# Patient Record
Sex: Female | Born: 1965 | Race: White | Hispanic: Yes | Marital: Single | Smoking: Never smoker
Health system: Southern US, Community
[De-identification: ages and names within clinical notes are randomized; demographics above are authoritative.]

## PROBLEM LIST (undated history)

## (undated) DIAGNOSIS — G40909 Epilepsy, unspecified, not intractable, without status epilepticus: Secondary | ICD-10-CM

## (undated) DIAGNOSIS — IMO0001 Reserved for inherently not codable concepts without codable children: Secondary | ICD-10-CM

## (undated) HISTORY — DX: Reserved for inherently not codable concepts without codable children: IMO0001

## (undated) HISTORY — PX: OTHER SURGICAL HISTORY: SHX169

## (undated) HISTORY — PX: RHINOPLASTY: SUR1284

## (undated) HISTORY — DX: Epilepsy, unspecified, not intractable, without status epilepticus: G40.909

---

## 2009-11-20 ENCOUNTER — Ambulatory Visit: Payer: Self-pay | Admitting: Diagnostic Radiology

## 2009-11-20 ENCOUNTER — Emergency Department (HOSPITAL_BASED_OUTPATIENT_CLINIC_OR_DEPARTMENT_OTHER): Admission: EM | Admit: 2009-11-20 | Discharge: 2009-11-20 | Payer: Self-pay | Admitting: Emergency Medicine

## 2010-08-31 IMAGING — CR DG WRIST COMPLETE 3+V*L*
4 series · 4 of 4 positions shown · non-contrast
Comparison: None

CLINICAL DATA: Patient fell on ice injuring left wrist

LEFT WRIST - COMPLETE 3+ VIEW

[x wrist pa left]
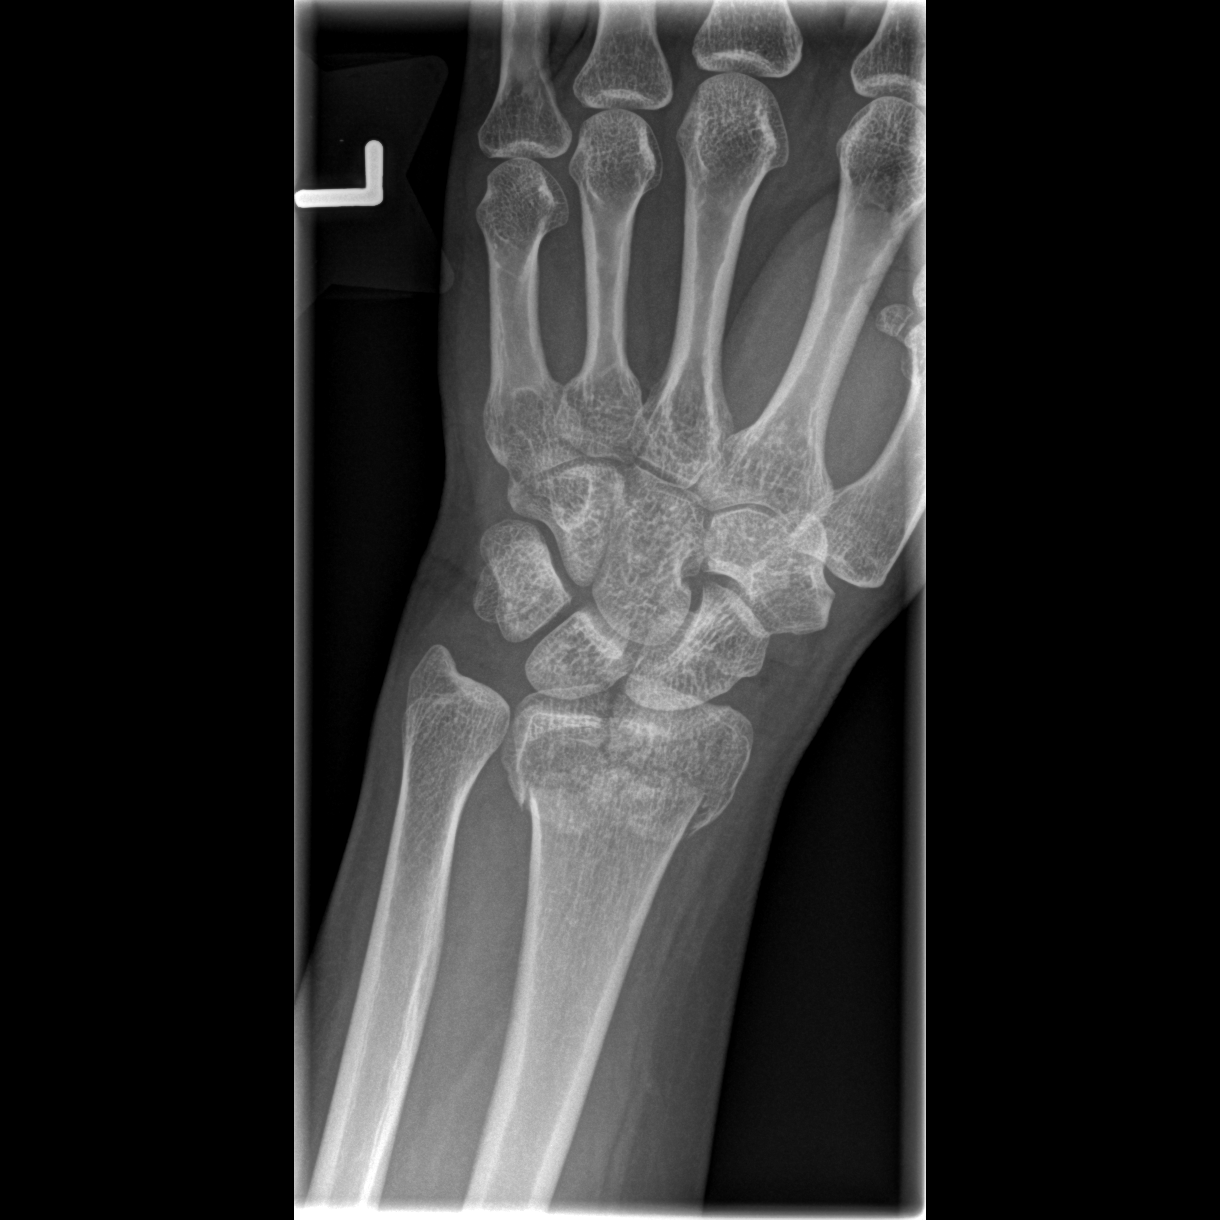

[x wrist obl left]
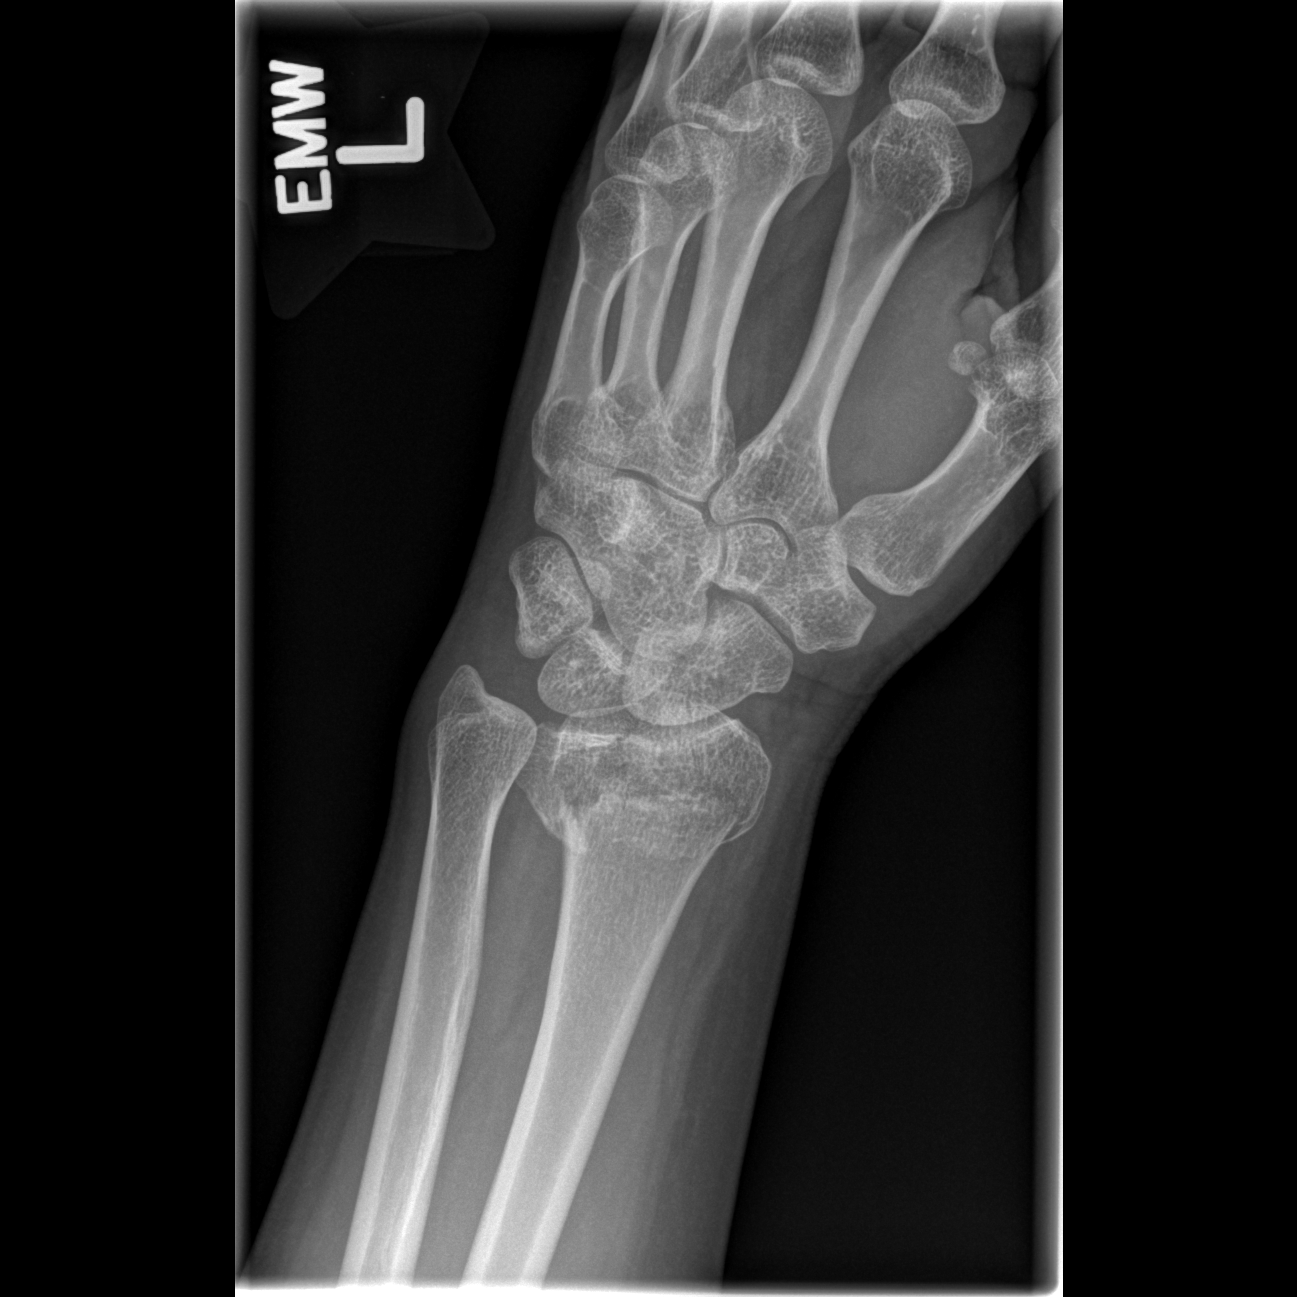

[x wrist lat left]
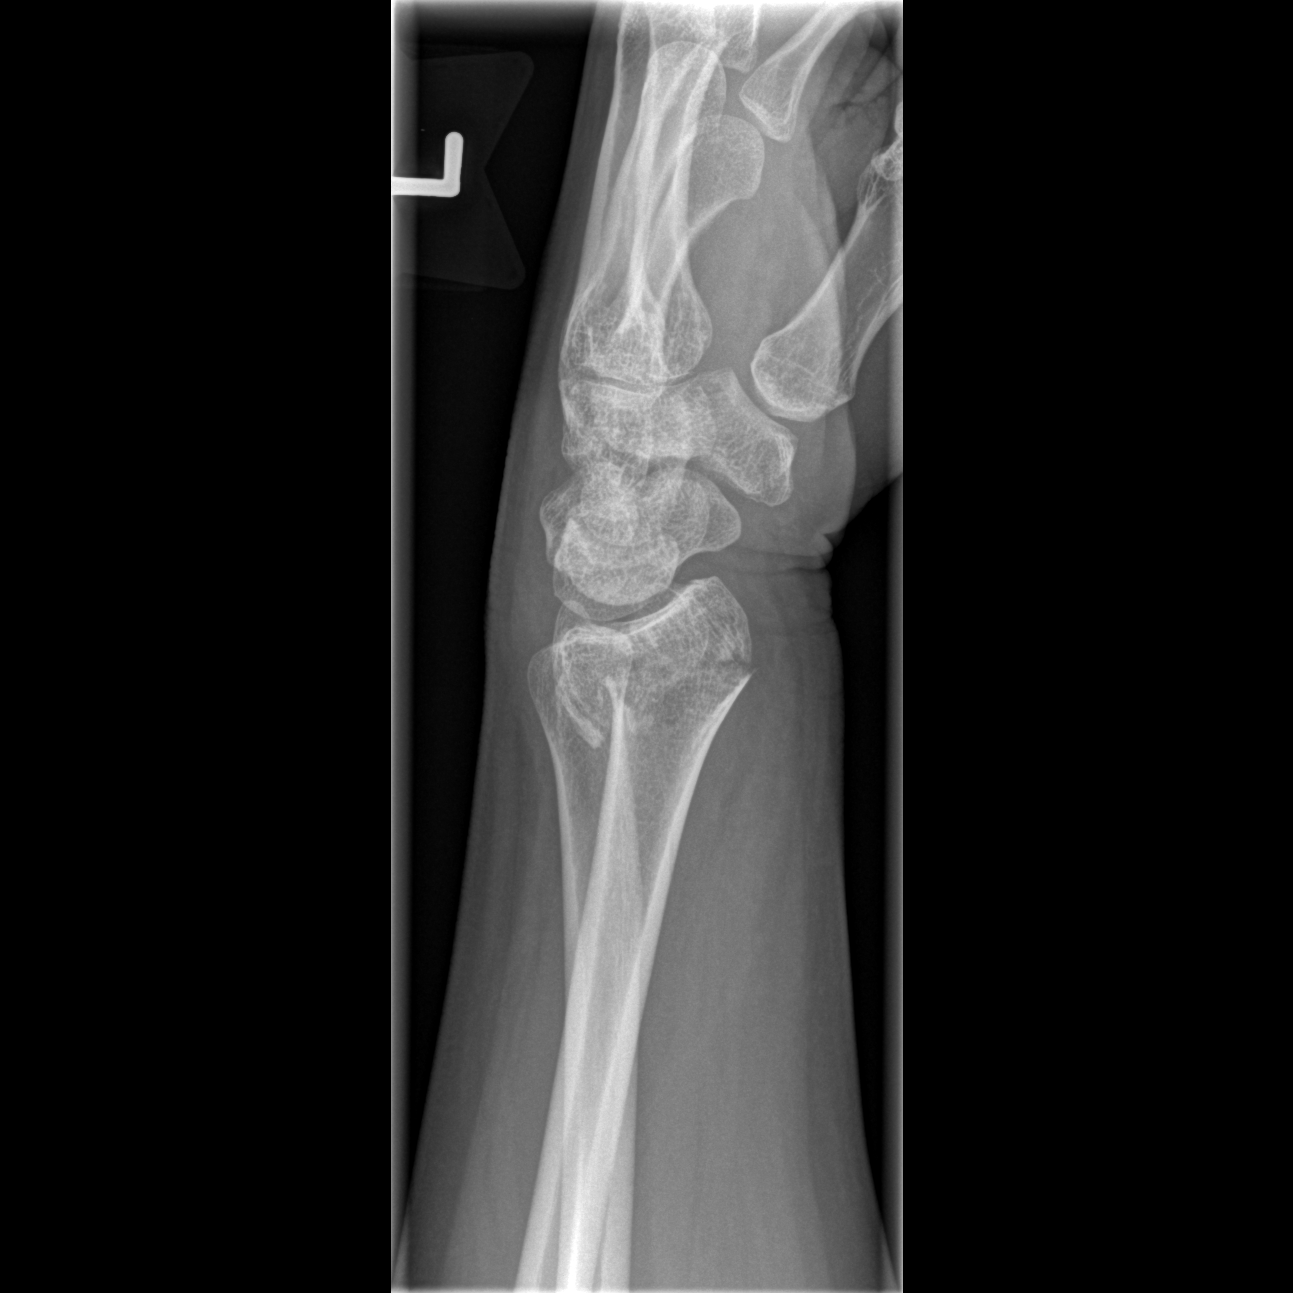

[x navicular]
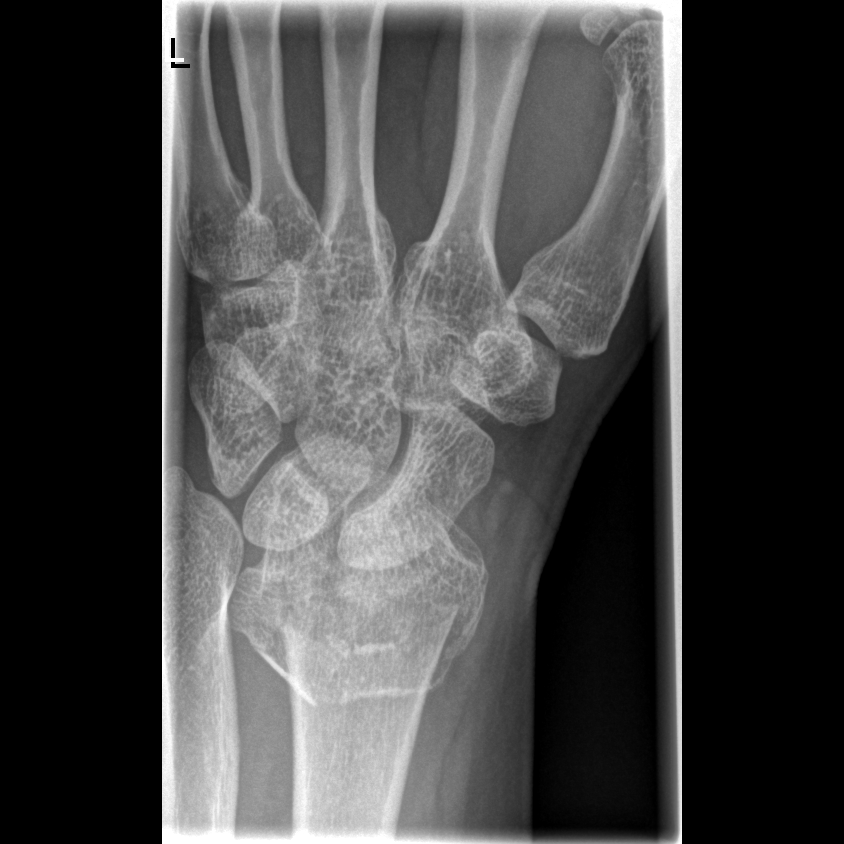

[4 of 4 positions shown; findings below may reference images not displayed]

FINDINGS: There is a transverse comminuted slightly impacted and
angulated fracture of the distal left radius with the fracture
extending intra-articular.  The ulnar styloid is intact.  The
carpal bones are in normal position.
IMPRESSION: Impacted slightly angulated comminuted fracture of the distal left
radius which extends intra-articular.

## 2010-11-30 ENCOUNTER — Encounter: Payer: Self-pay | Admitting: Internal Medicine

## 2011-05-28 ENCOUNTER — Other Ambulatory Visit: Payer: Self-pay | Admitting: Diagnostic Neuroimaging

## 2011-05-28 DIAGNOSIS — G40309 Generalized idiopathic epilepsy and epileptic syndromes, not intractable, without status epilepticus: Secondary | ICD-10-CM

## 2011-05-28 DIAGNOSIS — G40209 Localization-related (focal) (partial) symptomatic epilepsy and epileptic syndromes with complex partial seizures, not intractable, without status epilepticus: Secondary | ICD-10-CM

## 2012-08-17 ENCOUNTER — Encounter: Payer: Self-pay | Admitting: Internal Medicine

## 2012-11-03 ENCOUNTER — Ambulatory Visit (INDEPENDENT_AMBULATORY_CARE_PROVIDER_SITE_OTHER): Payer: BC Managed Care – PPO | Admitting: Internal Medicine

## 2012-11-03 ENCOUNTER — Encounter: Payer: Self-pay | Admitting: Internal Medicine

## 2012-11-03 VITALS — BP 128/82 | HR 96 | Temp 98.2°F | Ht 63.0 in | Wt 150.0 lb

## 2012-11-03 DIAGNOSIS — D509 Iron deficiency anemia, unspecified: Secondary | ICD-10-CM

## 2012-11-03 DIAGNOSIS — E611 Iron deficiency: Secondary | ICD-10-CM

## 2012-11-03 DIAGNOSIS — Z0000 Encounter for general adult medical examination without abnormal findings: Secondary | ICD-10-CM

## 2012-11-03 DIAGNOSIS — G40909 Epilepsy, unspecified, not intractable, without status epilepticus: Secondary | ICD-10-CM | POA: Insufficient documentation

## 2012-11-03 LAB — CBC WITH DIFFERENTIAL/PLATELET
Basophils Relative: 0.4 % (ref 0.0–3.0)
Eosinophils Relative: 1.8 % (ref 0.0–5.0)
Hemoglobin: 13.1 g/dL (ref 12.0–15.0)
Lymphocytes Relative: 18 % (ref 12.0–46.0)
MCV: 87.7 fl (ref 78.0–100.0)
Monocytes Relative: 7.3 % (ref 3.0–12.0)
Neutro Abs: 6 10*3/uL (ref 1.4–7.7)
Neutrophils Relative %: 72.5 % (ref 43.0–77.0)

## 2012-11-03 LAB — IRON: Iron: 73 ug/dL (ref 42–145)

## 2012-11-03 NOTE — Assessment & Plan Note (Signed)
Few months ago, she complained of dizziness, after  labs she was told she had iron deficiency, took iron by mouth temporarily, dizziness decreased to some extent. Plan: Labs

## 2012-11-03 NOTE — Assessment & Plan Note (Addendum)
G0P0 Last Pap smear and mammogram 10/2011 Plans to see her  gynecologist. Flu shot discussed, declined

## 2012-11-03 NOTE — Progress Notes (Signed)
  Subjective:    Patient ID: Karla Bell, female    DOB: 07-19-1966, 46 y.o.   MRN: [id number]  HPI New patient, here to get established History of epilepsy, symptoms well-controlled on the same dose of phenobarbital for years. History of iron deficiency, diagnosed 3 months ago, see assessment and plan. Several years history of dizziness, symptoms last a few seconds, not associated with seizures activities, usually triggered by stress/depression. At some point her previous MD thought that dizziness was related to low iron, prescribed iron and she felt a slightly better temporarily.  Past Medical History  Diagnosis Date  . Epilepsy     dx at age 47   . Contraception     condoms    Past Surgical History  Procedure Date  . No past surgeries    History   Social History  . Marital Status: Single    Spouse Name: N/A    Number of Children: 0  . Years of Education: N/A   Occupational History  . 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 Smoking status: Never Smoker   . Smokeless tobacco: Never Used  . Alcohol Use: Yes     Comment: socially   . Drug Use: No  . Sexually Active: Not on file   Other Topics Concern  . Not on file   Social History Narrative   Born in [place], moved to [place] 2000, in [place] since ~ 2008. lives by herself, has a boyfriend   No family history on file.   Review of Systems Denies any headaches, nausea, vomiting. No blood in the stools. Periods are monthly, they last 5 days, the first 2 days are heavy.     Objective:   Physical Exam  General -- alert, well-developed   Neck --no thyromegaly Lungs -- normal respiratory effort, no intercostal retractions, no accessory muscle use, and normal breath sounds.   Heart-- normal rate, regular rhythm, no murmur, and no gallop.   Abdomen--soft, non-tender, no distention, no masses, no HSM, no guarding, and no rigidity.   Extremities-- no pretibial edema  bilaterally  Neurologic-- alert & oriented X3 ; speech gait and motor are intact. Psych-- Cognition and judgment appear intact. Alert and cooperative with normal attention span and concentration.  not anxious appearing and not depressed appearing.      Assessment & Plan:

## 2012-11-03 NOTE — Assessment & Plan Note (Addendum)
Diagnosed with epilepsy at age 46, later on she was started on phenobarbital and has been basically well controlled for many years. Has seen in the recent past a neurologist in [place] but would like to change provider. Also has chronic dizziness, at some point felt to be due to iron deficiency. Will ask neurology to asses  dizziness as well. Plan: Refer to neurology [facility], Dr. Craige Cotta

## 2012-11-09 ENCOUNTER — Encounter: Payer: Self-pay | Admitting: *Deleted

## 2012-12-18 ENCOUNTER — Ambulatory Visit: Payer: BC Managed Care – PPO | Admitting: Obstetrics and Gynecology

## 2012-12-18 ENCOUNTER — Encounter: Payer: Self-pay | Admitting: Obstetrics and Gynecology

## 2012-12-18 VITALS — BP 110/70 | HR 70 | Resp 16 | Ht 61.0 in | Wt 147.0 lb

## 2012-12-18 DIAGNOSIS — Z124 Encounter for screening for malignant neoplasm of cervix: Secondary | ICD-10-CM

## 2012-12-18 DIAGNOSIS — Z01419 Encounter for gynecological examination (general) (routine) without abnormal findings: Secondary | ICD-10-CM

## 2012-12-18 MED ORDER — NONFORMULARY OR COMPOUNDED ITEM: 600.0000 mg | Status: DC

## 2012-12-18 NOTE — Progress Notes (Signed)
ANNUAL GYNECOLOGIC EXAMINATION   Karla Bell is a 47 y.o. female, No obstetric history on file., who presents for an annual exam.  The patient has a history of epilepsy.  She still wants to have a towel one day.    History   Social History  . Marital Status: Single    Spouse Name: N/A    Number of Children: 0  . Years of Education: N/A   Occupational History  . 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 Smoking status: Never Smoker   . Smokeless tobacco: Never Used  . Alcohol Use: Yes     Comment: socially   . Drug Use: No  . Sexually Active: Yes    Birth Control/ Protection: None, Condom   Other Topics Concern  . None   Social History Narrative   Born in [place], moved to [place] 2000, in [place] since ~ 2008. lives by herself, has a boyfriend             Menstrual cycle:   LMP: Patient's last menstrual period was 12/12/2012.             The following portions of the patient's history were reviewed and updated as appropriate: allergies, current medications, past family history, past medical history, past social history, past surgical history and problem list.  Review of Systems Pertinent items are noted in HPI. Breast:Negative for breast lump,nipple discharge or nipple retraction Gastrointestinal: Negative for abdominal pain, change in bowel habits or rectal bleeding Urinary:negative   Objective:    BP 110/70  Pulse 70  Resp 16  Ht 5\' 1"  (1.549 m)  Wt 147 lb (66.679 kg)  BMI 27.79 kg/m2  LMP 12/12/2012    Weight:  Wt Readings from Last 1 Encounters:  12/18/12 147 lb (66.679 kg)          BMI: Body mass index is 27.79 kg/(m^2).  General Appearance: Alert, appropriate appearance for age. No acute distress HEENT: Grossly normal Neck / Thyroid: Supple, no masses, nodes or enlargement Lungs: clear to auscultation bilaterally Back: No CVA tenderness Breast Exam: No masses or nodes.No dimpling, nipple  retraction or discharge. Cardiovascular: Regular rate and rhythm. S1, S2, no murmur Gastrointestinal: Soft, non-tender, no masses or organomegaly  ++++++++++++++++++++++++++++++++++++++++++++++++++++++++  Pelvic Exam: External genitalia: normal general appearance Vaginal: normal without tenderness, induration or masses. Relaxation: No Cervix: normal appearance Adnexa: normal bimanual exam Uterus: normal size, shape, and consistency Rectovaginal: normal rectal, no masses  ++++++++++++++++++++++++++++++++++++++++++++++++++++++++  Lymphatic Exam: Non-palpable nodes in neck, clavicular, axillary, or inguinal regions Neurologic: Normal speech, no tremor  Psychiatric: Alert and oriented, appropriate affect.  Assessment:    Normal gyn exam   Overweight or obese: Yes   Pelvic relaxation: No  epilepsy   Plan:    mammogram pap smear return annually or prn Contraception:condoms    Medications prescribed: none  STD screen request: No   The updated Pap smear screening guidelines were discussed with the patient. The patient requested that I obtain a Pap smear: Yes.  Kegel exercises discussed: No.  Proper diet and regular exercise were reviewed.  Annual mammograms recommended starting at age 22. Proper breast care was discussed.  Screening colonoscopy is recommended beginning at age 70.  Regular health maintenance was reviewed.  Leonard Schwartz M.D.    Regular Periods: yes Mammogram: no  Monthly Breast Ex.: no Exercise: yes  Tetanus < 10 years: yes Seatbelts: yes  NI. Bladder Functn.: yes Abuse  at home: no  Daily BM's: yes Stressful Work: yes  Healthy Diet: yes Sigmoid-Colonoscopy: None  Calcium: no Medical problems this year: None   LAST PAP:09/09/2011 WNL  Contraception: Condoms  Mammogram:  None  PCP: Hose Paz  PMH: None  FMH: NOne  Last Bone Scan: None

## 2012-12-19 LAB — PAP IG W/ RFLX HPV ASCU

## 2013-09-03 ENCOUNTER — Ambulatory Visit: Payer: BC Managed Care – PPO | Admitting: Internal Medicine

## 2013-09-10 ENCOUNTER — Encounter: Payer: Self-pay | Admitting: Internal Medicine

## 2013-09-10 ENCOUNTER — Ambulatory Visit (INDEPENDENT_AMBULATORY_CARE_PROVIDER_SITE_OTHER): Payer: BC Managed Care – PPO | Admitting: Internal Medicine

## 2013-09-10 ENCOUNTER — Telehealth: Payer: Self-pay | Admitting: *Deleted

## 2013-09-10 VITALS — BP 128/87 | HR 82 | Temp 98.5°F

## 2013-09-10 DIAGNOSIS — Z0289 Encounter for other administrative examinations: Secondary | ICD-10-CM

## 2013-09-10 DIAGNOSIS — K5900 Constipation, unspecified: Secondary | ICD-10-CM

## 2013-09-10 NOTE — Telephone Encounter (Signed)
09/10/2013  Pt came for appt today at 2:30 for irregular bowel movements.  Dr Drue Novel was running behind, and pt had to get back to work, so left with out being seen.  Pt was refunded copay and said she will call to reschedule.  bw

## 2013-09-13 NOTE — Progress Notes (Signed)
  Subjective:    Patient ID: Karla Bell, female    DOB: [birth year], 47 y.o.   MRN: [id number]  HPI Left w/o being seen   Review of Systems     Objective:   Physical Exam        Assessment & Plan:

## 2013-11-15 DIAGNOSIS — L908 Other atrophic disorders of skin: Secondary | ICD-10-CM

## 2013-11-15 DIAGNOSIS — L988 Other specified disorders of the skin and subcutaneous tissue: Secondary | ICD-10-CM | POA: Insufficient documentation

## 2013-11-30 ENCOUNTER — Other Ambulatory Visit: Payer: Self-pay | Admitting: Obstetrics and Gynecology

## 2013-12-03 ENCOUNTER — Encounter (HOSPITAL_COMMUNITY): Payer: Self-pay | Admitting: Pharmacist

## 2013-12-05 ENCOUNTER — Encounter (HOSPITAL_COMMUNITY)
Admission: RE | Admit: 2013-12-05 | Discharge: 2013-12-05 | Disposition: A | Discharge status: Home / Self Care | Payer: BC Managed Care – PPO | Source: Ambulatory Visit | Attending: Obstetrics and Gynecology | Admitting: Obstetrics and Gynecology

## 2013-12-05 ENCOUNTER — Encounter (HOSPITAL_COMMUNITY): Payer: Self-pay

## 2013-12-05 DIAGNOSIS — Z01812 Encounter for preprocedural laboratory examination: Secondary | ICD-10-CM | POA: Insufficient documentation

## 2013-12-05 DIAGNOSIS — Z01818 Encounter for other preprocedural examination: Secondary | ICD-10-CM | POA: Insufficient documentation

## 2013-12-05 LAB — CBC
HCT: 32.4 % — ABNORMAL LOW (ref 36.0–46.0)
Hemoglobin: 10 g/dL — ABNORMAL LOW (ref 12.0–15.0)
MCH: 21.9 pg — ABNORMAL LOW (ref 26.0–34.0)
MCHC: 30.9 g/dL (ref 30.0–36.0)
MCV: 70.9 fL — ABNORMAL LOW (ref 78.0–100.0)
PLATELETS: 275 10*3/uL (ref 150–400)
RBC: 4.57 MIL/uL (ref 3.87–5.11)
RDW: 18.2 % — AB (ref 11.5–15.5)
WBC: 6.4 10*3/uL (ref 4.0–10.5)

## 2013-12-05 NOTE — Patient Instructions (Addendum)
   Your procedure is scheduled on:  Wednesday, Feb 4  Enter through the [employer] [facility] at:  11 AM Pick up the phone at the desk and dial [phone] and inform us of your arrival.  Please call this number if you have any problems the morning of surgery: [phone]  Remember: Do not eat food after midnight: Tuesday Do not drink clear liquids after: 830 AM Wednesday, day of surgery Take these medicines the morning of surgery with a SIP OF WATER:   Keppra, PHENObarbital  Do not wear jewelry, make-up, or FINGER nail polish No metal in your hair or on your body. Do not wear lotions, powders, perfumes.  You may wear deodorant.   Do not bring valuables to the hospital. Contacts, dentures or bridgework may not be worn into surgery.  Patients discharged on the day of surgery will not be allowed to drive home.  Home with boyfriend Randall Hiss cell [phone].

## 2013-12-06 ENCOUNTER — Encounter: Payer: Self-pay | Admitting: Obstetrics and Gynecology

## 2013-12-06 ENCOUNTER — Other Ambulatory Visit (HOSPITAL_COMMUNITY): Payer: Self-pay | Admitting: Obstetrics and Gynecology

## 2013-12-06 NOTE — H&P (Signed)
Karla Bell is an 48 y.o. female. Pt presented to [facility], PA-C c/o post coital bleeding and had an u/s showing a 2.2cm endometrial polyp.  She discussed options and recs with the patient and the patien wanted to proceed with removal and was scheduled for hysteroscopy/D&C with removal of em polyp.  Pertinent Gynecological History: No h/o abnl paps  Menstrual History:  Patient's last menstrual period was 11/14/2013.    Past Medical History  Diagnosis Date  . Contraception     condoms   . Epilepsy     dx at age 77 - last seizure 4 yrs ago    Past Surgical History  Procedure Laterality Date  . Rhinoplasty    . Left wrist surgery      No family history on file.  Social History:  reports that she has never smoked. She has never used smokeless tobacco. She reports that she drinks about 3.0 ounces of alcohol per week. She reports that she does not use illicit drugs.  Allergies: No Known Allergies   (Not in a hospital admission)  ROS Non-contributory.  Menses are otherwise nl with no intermenstrual bleeding.  Last menstrual period 11/14/2013. Physical Exam  Lungs CTA CV RRR Abd soft, NT, Ext no calf tenderness Pelvic deferred to OR (pt declined in office day of preop appt bc it had recently been done by EP)  Results for orders placed during the hospital encounter of 12/05/13 (from the past 24 hour(s))  CBC     Status: Abnormal   Collection Time    12/05/13  2:45 PM      Result Value Range   WBC 6.4  4.0 - 10.5 K/uL   RBC 4.57  3.87 - 5.11 MIL/uL   Hemoglobin 10.0 (*) 12.0 - 15.0 g/dL   HCT 32.4 (*) 36.0 - 46.0 %   MCV 70.9 (*) 78.0 - 100.0 fL   MCH 21.9 (*) 26.0 - 34.0 pg   MCHC 30.9  30.0 - 36.0 g/dL   RDW 18.2 (*) 11.5 - 15.5 %   Platelets 275  150 - 400 K/uL    No results found.  Assessment/Plan: P0 with post coital bleeding dx'd with 2.2cm endometrial polyp.  Pt wants to proceed with hysteroscopy/D&C/Polypectomy/possible resection.  R/B/A discussed.   Questions answered.  Pt is scheduled at [facility] on 12/12/13.  Ozella Comins Y 12/06/2013, 2:09 PM

## 2013-12-06 NOTE — H&P (Signed)
Karla Bell is a 48 y.o. female G 0 for hysteroscopic resection of and endometrial mass.  For the past year the patient's  menses had become progressively heavy,  lasting 5 days and requiring, instead of 2 pads a day, 5 pad a day.  She denies any cramps but in recent months has developed post-coital bleeding that lasts  for 10 days after intercourse.  She denies any pain with intercourse, vaginitis symptoms, changes in urinary function or bowel function (history of constipation).    A sono-hysterogram/ultrasound showed: uterus:6.64 x 5.63 x 4.75 cm with an 8.55 mm endometrium per 3D rendering; ; There was an anterior right intramural fibroid 2.97 x 1.88 x 2.26 cm;, a mass on posterior uterine cavity wall 2.2 cm x 0.89 cm (polyp vs fibroid) with no blood flow seen in this area.  Both ovaries appeared normal on this study. Length of uterus from fundus to external os 9.9 cm and cavity width-3.9, length-5.2. Given the findings on sonographic imaging and the disruptive nature of her symptoms,  the patient desires to proceed with hysteroscopic resection of her endometrial mass.  Past Medical History  OB History: G 0  GYN History: menarche: 48 YO;  LMP: 11/05/2013;    Contracepton: none  Denies history of abnormal PAP smear;   Last PAP smear: 12/2012  Medical History: Left Wrist Fracture, Seizure Disorder, Anemia, Anxiety and Infertility  Surgical History: 1995 Rhinoplasty;   2010 Left Wrist ORIF with Screw Placement and 2011 Liposuction-cheeks Denies problems with anesthesia or history of blood transfusions  Family History: Alzheimer's Dementia and Hypertension  Social History: Divorced and employed with [employer];  Denies tobacco use but consumes alcohol on occasion   Medications:  Phenobarbital 100 mg,  2 po qhs Levetiracetam ER 500 mg 2 po qhs Antipyrine-benzocaine Ear Drops  prn Tretinoin 0.05% Topical Cream apply to face daily qhs  No Known Allergies  Denies sensitivity to peanuts,  shellfish, soy, latex or adhesives.    ROS:Admits to reading glasses, constipation, shortness of breath with anxiety, dry/cracked inflamed hands (patient washes her hands frequently) but denies: headache, vision changes, nasal congestion, dysphagia, tinnitus, dizziness, hoarseness, cough,  chest pain, nausea, vomiting, diarrhea,constipation,  urinary frequency, urgency  dysuria, hematuria, vaginitis symptoms, pelvic pain, swelling of joints,easy bruising,  myalgias, arthralgias, unexplained weight loss and except as is mentioned in the history of present illness, patient's review of systems is otherwise negative.  Physical Exam  Bp: 110/70;   P: 70;  R: 18;   Temperature: 97.8 degrees F orally;   Weight: 144 lbs.   Height: 5' 1"   BMI: 27.2  Neck: supple without masses or thyromegaly Lungs: clear to auscultation Heart: regular rate and rhythm Abdomen: soft, non-tender and no organomegaly Pelvic:EGBUS- wnl; vagina-normal rugae; uterus-normal size, cervix without lesions or motion tenderness; adnexae-no tenderness or masses Extremities:  no clubbing, cyanosis or edema   Assesment: Post-coital Bleeding           Menorrhagia   Disposition:  A discussion was held with patient regarding the indication for her procedure(s) along with the risks, which include but are not limited to: reaction to anesthesia, damage to adjacent organs, infection and excessive bleeding. The patient verbalized understanding of these risks and has consented to proceed with Hysterocopic Resection of Endometrial Mass at [facility], December 12, 2013 at 1:30 p.m.   CSN# [id number]   Latavia Goga J. Eureka Valdes, PA-C  for Dr. Angela Y. Roberts   

## 2013-12-12 ENCOUNTER — Ambulatory Visit (HOSPITAL_COMMUNITY): Payer: BC Managed Care – PPO | Admitting: Anesthesiology

## 2013-12-12 ENCOUNTER — Encounter (HOSPITAL_COMMUNITY): Payer: BC Managed Care – PPO | Admitting: Anesthesiology

## 2013-12-12 ENCOUNTER — Encounter (HOSPITAL_COMMUNITY)
Admission: RE | Disposition: A | Discharge status: Home / Self Care | Payer: Self-pay | Source: Ambulatory Visit | Attending: Obstetrics and Gynecology

## 2013-12-12 ENCOUNTER — Ambulatory Visit (HOSPITAL_COMMUNITY)
Admission: RE | Admit: 2013-12-12 | Discharge: 2013-12-12 | Disposition: A | Discharge status: Home / Self Care | Payer: BC Managed Care – PPO | Source: Ambulatory Visit | Attending: Obstetrics and Gynecology | Admitting: Obstetrics and Gynecology

## 2013-12-12 ENCOUNTER — Encounter (HOSPITAL_COMMUNITY): Payer: Self-pay | Admitting: *Deleted

## 2013-12-12 DIAGNOSIS — N930 Postcoital and contact bleeding: Secondary | ICD-10-CM | POA: Insufficient documentation

## 2013-12-12 DIAGNOSIS — D251 Intramural leiomyoma of uterus: Secondary | ICD-10-CM | POA: Insufficient documentation

## 2013-12-12 DIAGNOSIS — G40909 Epilepsy, unspecified, not intractable, without status epilepticus: Secondary | ICD-10-CM | POA: Insufficient documentation

## 2013-12-12 DIAGNOSIS — N840 Polyp of corpus uteri: Secondary | ICD-10-CM | POA: Insufficient documentation

## 2013-12-12 DIAGNOSIS — N920 Excessive and frequent menstruation with regular cycle: Secondary | ICD-10-CM | POA: Insufficient documentation

## 2013-12-12 HISTORY — PX: DILITATION & CURRETTAGE/HYSTROSCOPY WITH VERSAPOINT RESECTION: SHX5571

## 2013-12-12 LAB — HCG, SERUM, QUALITATIVE: PREG SERUM: NEGATIVE

## 2013-12-12 SURGERY — DILATATION & CURETTAGE/HYSTEROSCOPY WITH VERSAPOINT RESECTION
Anesthesia: General

## 2013-12-12 MED ORDER — MIDAZOLAM HCL 2 MG/2ML IJ SOLN
INTRAMUSCULAR | Status: DC | PRN
  Administered 2013-12-12: 2 mg via INTRAVENOUS

## 2013-12-12 MED ORDER — KETOROLAC TROMETHAMINE 30 MG/ML IJ SOLN: 15.0000 mg | Freq: Once | INTRAMUSCULAR | Status: DC | PRN

## 2013-12-12 MED ORDER — LIDOCAINE HCL 1 % IJ SOLN
INTRAMUSCULAR | Status: DC | PRN
  Administered 2013-12-12: 10 mL

## 2013-12-12 MED ORDER — PROPOFOL 10 MG/ML IV BOLUS
INTRAVENOUS | Status: DC | PRN
  Administered 2013-12-12: 180 mg via INTRAVENOUS

## 2013-12-12 MED ORDER — DEXAMETHASONE SODIUM PHOSPHATE 10 MG/ML IJ SOLN
INTRAMUSCULAR | Status: AC
  Filled 2013-12-12: qty 1

## 2013-12-12 MED ORDER — KETOROLAC TROMETHAMINE 30 MG/ML IJ SOLN
INTRAMUSCULAR | Status: DC | PRN
  Administered 2013-12-12: 30 mg via INTRAVENOUS

## 2013-12-12 MED ORDER — LIDOCAINE HCL (CARDIAC) 20 MG/ML IV SOLN
INTRAVENOUS | Status: DC | PRN
  Administered 2013-12-12: 50 mg via INTRAVENOUS

## 2013-12-12 MED ORDER — PHENYLEPHRINE 40 MCG/ML (10ML) SYRINGE FOR IV PUSH (FOR BLOOD PRESSURE SUPPORT)
PREFILLED_SYRINGE | INTRAVENOUS | Status: AC
  Filled 2013-12-12: qty 5

## 2013-12-12 MED ORDER — PROPOFOL 10 MG/ML IV EMUL
INTRAVENOUS | Status: AC
  Filled 2013-12-12: qty 20

## 2013-12-12 MED ORDER — PROMETHAZINE HCL 25 MG/ML IJ SOLN: 6.2500 mg | INTRAMUSCULAR | Status: DC | PRN

## 2013-12-12 MED ORDER — ONDANSETRON HCL 4 MG/2ML IJ SOLN
INTRAMUSCULAR | Status: AC
  Filled 2013-12-12: qty 2

## 2013-12-12 MED ORDER — LIDOCAINE HCL 1 % IJ SOLN
INTRAMUSCULAR | Status: AC
  Filled 2013-12-12: qty 20

## 2013-12-12 MED ORDER — MEPERIDINE HCL 25 MG/ML IJ SOLN: 6.2500 mg | INTRAMUSCULAR | Status: DC | PRN

## 2013-12-12 MED ORDER — SODIUM CHLORIDE 0.9 % IR SOLN
Status: DC | PRN
  Administered 2013-12-12: 3000 mL

## 2013-12-12 MED ORDER — IBUPROFEN 600 MG PO TABS: 600.0000 mg | ORAL_TABLET | Freq: Four times a day (QID) | ORAL | Status: DC | PRN

## 2013-12-12 MED ORDER — FENTANYL CITRATE 0.05 MG/ML IJ SOLN
INTRAMUSCULAR | Status: DC | PRN
  Administered 2013-12-12: 50 ug via INTRAVENOUS
  Administered 2013-12-12: 100 ug via INTRAVENOUS

## 2013-12-12 MED ORDER — FENTANYL CITRATE 0.05 MG/ML IJ SOLN
INTRAMUSCULAR | Status: AC
  Filled 2013-12-12: qty 5

## 2013-12-12 MED ORDER — OXYCODONE-ACETAMINOPHEN 5-325 MG PO TABS: 1.0000 | ORAL_TABLET | Freq: Four times a day (QID) | ORAL | Status: DC | PRN

## 2013-12-12 MED ORDER — MIDAZOLAM HCL 2 MG/2ML IJ SOLN
INTRAMUSCULAR | Status: AC
  Filled 2013-12-12: qty 2

## 2013-12-12 MED ORDER — FENTANYL CITRATE 0.05 MG/ML IJ SOLN: 25.0000 ug | INTRAMUSCULAR | Status: DC | PRN

## 2013-12-12 MED ORDER — DEXAMETHASONE SODIUM PHOSPHATE 10 MG/ML IJ SOLN
INTRAMUSCULAR | Status: DC | PRN
  Administered 2013-12-12: 10 mg via INTRAVENOUS

## 2013-12-12 MED ORDER — KETOROLAC TROMETHAMINE 30 MG/ML IJ SOLN
INTRAMUSCULAR | Status: AC
  Filled 2013-12-12: qty 1

## 2013-12-12 MED ORDER — ROCURONIUM BROMIDE 100 MG/10ML IV SOLN
INTRAVENOUS | Status: AC
  Filled 2013-12-12: qty 1

## 2013-12-12 MED ORDER — LACTATED RINGERS IV SOLN
INTRAVENOUS | Status: DC
  Administered 2013-12-12 (×3): via INTRAVENOUS

## 2013-12-12 MED ORDER — LIDOCAINE HCL (CARDIAC) 20 MG/ML IV SOLN
INTRAVENOUS | Status: AC
  Filled 2013-12-12: qty 5

## 2013-12-12 MED ORDER — ONDANSETRON HCL 4 MG/2ML IJ SOLN
INTRAMUSCULAR | Status: DC | PRN
  Administered 2013-12-12: 4 mg via INTRAVENOUS

## 2013-12-12 MED ORDER — MIDAZOLAM HCL 2 MG/2ML IJ SOLN: 0.5000 mg | Freq: Once | INTRAMUSCULAR | Status: DC | PRN

## 2013-12-12 SURGICAL SUPPLY — 18 items
CANISTER SUCT 3000ML (MISCELLANEOUS) ×3 IMPLANT
CATH ROBINSON RED A/P 16FR (CATHETERS) ×3 IMPLANT
CLOTH BEACON ORANGE TIMEOUT ST (SAFETY) ×3 IMPLANT
CONTAINER PREFILL 10% NBF 60ML (FORM) ×6 IMPLANT
DRSG TELFA 3X8 NADH (GAUZE/BANDAGES/DRESSINGS) ×3 IMPLANT
ELECT REM PT RETURN 9FT ADLT (ELECTROSURGICAL) ×3
ELECTRODE REM PT RTRN 9FT ADLT (ELECTROSURGICAL) ×1 IMPLANT
ELECTRODE RT ANGLE VERSAPOINT (CUTTING LOOP) ×3 IMPLANT
GLOVE BIO SURGEON STRL SZ7.5 (GLOVE) ×3 IMPLANT
GLOVE BIOGEL PI IND STRL 7.5 (GLOVE) ×1 IMPLANT
GLOVE BIOGEL PI INDICATOR 7.5 (GLOVE) ×2
GOWN STRL REUS W/ TWL XL LVL3 (GOWN DISPOSABLE) ×1 IMPLANT
GOWN STRL REUS W/TWL LRG LVL3 (GOWN DISPOSABLE) ×3 IMPLANT
GOWN STRL REUS W/TWL XL LVL3 (GOWN DISPOSABLE) ×2
PACK HYSTEROSCOPY LF (CUSTOM PROCEDURE TRAY) ×3 IMPLANT
PAD OB MATERNITY 4.3X12.25 (PERSONAL CARE ITEMS) ×3 IMPLANT
TOWEL OR 17X24 6PK STRL BLUE (TOWEL DISPOSABLE) ×6 IMPLANT
WATER STERILE IRR 1000ML POUR (IV SOLUTION) ×3 IMPLANT

## 2013-12-12 NOTE — Interval H&P Note (Signed)
History and Physical Interval Note:  12/12/2013 12:50 PM  Karla Bell  has presented today for surgery, with the diagnosis of abdominal pain  The various methods of treatment have been discussed with the patient and family. After consideration of risks, benefits and other options for treatment, the patient has consented to  Procedure(s): DILATATION & CURETTAGE/HYSTEROSCOPY WITH VERSAPOINT RESECTION (N/A) as a surgical intervention .  The patient's history has been reviewed, patient examined, no change in status, stable for surgery.  I have reviewed the patient's chart and labs.  Questions were answered to the patient's satisfaction.     Delice Lesch

## 2013-12-12 NOTE — Addendum Note (Signed)
Addendum created 12/12/13 1555 by Asher Muir, CRNA   Modules edited: Anesthesia LDA

## 2013-12-12 NOTE — Anesthesia Preprocedure Evaluation (Signed)
Anesthesia Evaluation  Patient identified by MRN, date of birth, ID band Patient awake    Reviewed: Allergy & Precautions, H&P , Patient's Chart, lab work & pertinent test results, reviewed documented beta blocker date and time   History of Anesthesia Complications Negative for: history of anesthetic complications  Airway Mallampati: II TM Distance: >3 FB Neck ROM: full    Dental   Pulmonary  breath sounds clear to auscultation        Cardiovascular Exercise Tolerance: Good Rhythm:regular Rate:Normal     Neuro/Psych Seizures -,  negative psych ROS   GI/Hepatic   Endo/Other    Renal/GU      Musculoskeletal   Abdominal   Peds  Hematology   Anesthesia Other Findings   Reproductive/Obstetrics                           Anesthesia Physical Anesthesia Plan  ASA: II  Anesthesia Plan: General LMA   Post-op Pain Management:    Induction:   Airway Management Planned:   Additional Equipment:   Intra-op Plan:   Post-operative Plan:   Informed Consent: I have reviewed the patients History and Physical, chart, labs and discussed the procedure including the risks, benefits and alternatives for the proposed anesthesia with the patient or authorized representative who has indicated his/her understanding and acceptance.   Dental Advisory Given  Plan Discussed with: CRNA, Surgeon and Anesthesiologist  Anesthesia Plan Comments:         Anesthesia Quick Evaluation

## 2013-12-12 NOTE — H&P (View-Only) (Signed)
Karla Bell is a 48 y.o. female G 0 for hysteroscopic resection of and endometrial mass.  For the past year the patient's  menses had become progressively heavy,  lasting 5 days and requiring, instead of 2 pads a day, 5 pad a day.  She denies any cramps but in recent months has developed post-coital bleeding that lasts  for 10 days after intercourse.  She denies any pain with intercourse, vaginitis symptoms, changes in urinary function or bowel function (history of constipation).    A sono-hysterogram/ultrasound showed: uterus:6.64 x 5.63 x 4.75 cm with an 8.55 mm endometrium per 3D rendering; ; There was an anterior right intramural fibroid 2.97 x 1.88 x 2.26 cm;, a mass on posterior uterine cavity wall 2.2 cm x 0.89 cm (polyp vs fibroid) with no blood flow seen in this area.  Both ovaries appeared normal on this study. Length of uterus from fundus to external os 9.9 cm and cavity width-3.9, length-5.2. Given the findings on sonographic imaging and the disruptive nature of her symptoms,  the patient desires to proceed with hysteroscopic resection of her endometrial mass.  Past Medical History  OB History: G 0  GYN History: menarche: 48 YO;  LMP: 11/05/2013;    Contracepton: none  Denies history of abnormal PAP smear;   Last PAP smear: 12/2012  Medical History: Left Wrist Fracture, Seizure Disorder, Anemia, Anxiety and Infertility  Surgical History: 1995 Rhinoplasty;   2010 Left Wrist ORIF with Screw Placement and 2011 Liposuction-cheeks Denies problems with anesthesia or history of blood transfusions  Family History: Alzheimer's Dementia and Hypertension  Social History: Divorced and employed with [employer];  Denies tobacco use but consumes alcohol on occasion   Medications:  Phenobarbital 100 mg,  2 po qhs Levetiracetam ER 500 mg 2 po qhs Antipyrine-benzocaine Ear Drops  prn Tretinoin 0.05% Topical Cream apply to face daily qhs  No Known Allergies  Denies sensitivity to peanuts,  shellfish, soy, latex or adhesives.    ZTI:WPYKDX to reading glasses, constipation, shortness of breath with anxiety, dry/cracked inflamed hands (patient washes her hands frequently) but denies: headache, vision changes, nasal congestion, dysphagia, tinnitus, dizziness, hoarseness, cough,  chest pain, nausea, vomiting, diarrhea,constipation,  urinary frequency, urgency  dysuria, hematuria, vaginitis symptoms, pelvic pain, swelling of joints,easy bruising,  myalgias, arthralgias, unexplained weight loss and except as is mentioned in the history of present illness, patient's review of systems is otherwise negative.  Physical Exam  Bp: 110/70;   P: 70;  R: 18;   Temperature: 97.8 degrees F orally;   Weight: 144 lbs.   Height: 5\' 1"    BMI: 27.2  Neck: supple without masses or thyromegaly Lungs: clear to auscultation Heart: regular rate and rhythm Abdomen: soft, non-tender and no organomegaly Pelvic:EGBUS- wnl; vagina-normal rugae; uterus-normal size, cervix without lesions or motion tenderness; adnexae-no tenderness or masses Extremities:  no clubbing, cyanosis or edema   Assesment: Post-coital Bleeding           Menorrhagia   Disposition:  A discussion was held with patient regarding the indication for her procedure(s) along with the risks, which include but are not limited to: reaction to anesthesia, damage to adjacent organs, infection and excessive bleeding. The patient verbalized understanding of these risks and has consented to proceed with Hysterocopic Resection of Endometrial Mass at [place], December 12, 2013 at 1:30 p.m.   CSN# [id number]   Rickie Gutierres J. Florene Glen, PA-C  for Dr. Harvie Bridge. Mancel Bale

## 2013-12-12 NOTE — Anesthesia Postprocedure Evaluation (Signed)
Anesthesia Post Note  Patient: Karla Bell  Procedure(s) Performed: Procedure(s) (LRB): DILATATION & CURETTAGE/HYSTEROSCOPY WITH VERSAPOINT RESECTION (N/A)  Anesthesia type: GA  Patient location: PACU  Post pain: Pain level controlled  Post assessment: Post-op Vital signs reviewed  Last Vitals:  Filed Vitals:   12/12/13 1415  BP: 117/68  Pulse: 67  Temp:   Resp: 18    Post vital signs: Reviewed  Level of consciousness: sedated  Complications: No apparent anesthesia complications

## 2013-12-12 NOTE — Discharge Instructions (Signed)

## 2013-12-12 NOTE — Transfer of Care (Signed)
Immediate Anesthesia Transfer of Care Note  Patient: Karla Bell  Procedure(s) Performed: Procedure(s): DILATATION & CURETTAGE/HYSTEROSCOPY WITH VERSAPOINT RESECTION (N/A)  Patient Location: PACU  Anesthesia Type:General  Level of Consciousness: sedated  Airway & Oxygen Therapy: Patient Spontanous Breathing and Patient connected to nasal cannula oxygen  Post-op Assessment: Report given to PACU RN  Post vital signs: Reviewed and stable  Complications: No apparent anesthesia complications

## 2013-12-12 NOTE — Op Note (Signed)
Preop Diagnosis: Postcoital Bleeding   Postop Diagnosis: Postcoital Bleeding   Procedure: DILATATION & CURETTAGE/HYSTEROSCOPY WITH VERSAPOINT RESECTION   Anesthesia: General   Anesthesiologist: Assunta Gambles, MD   Attending: Delice Lesch, MD   Assistant: N/a  Findings: ?polyp on anterior wall of uterus  Pathology: Polyp with endometrial curettings  Fluids: 1200 cc  UOP: 300 cc  EBL: Minimal  Complications: None  Procedure: The patient was taken to the operating room after the risks, benefits and alternatives were discussed with the patient. The patient verbalized understanding and consent signed and witnessed. The patient was placed under general anesthesia with an LMA per anesthesiologist and prepped and draped in the normal sterile fashion.  Time Out was performed per protocol.  A bivalve speculum was placed in the patient's vagina and the anterior lip of the cervix was grasped with a single tooth tenaculum. A paracervical block was administered using a total of 10 cc of 1% lidocaine. The uterus sounded to 10 cm. The cervix was dilated for passage of the hysteroscope.  The hysteroscope was introduced into the uterine cavity and findings as noted above. Sharp curettage was performed until a gritty texture was noted and curettings were sent to pathology. The hysteroscope was reintroduced and no obvious remaining intracavitary lesions were noted.  All instruments were removed. Sponge lap and needle count was correct. The patient tolerated the procedure well and was returned to the recovery room in good condition.

## 2013-12-14 ENCOUNTER — Encounter (HOSPITAL_COMMUNITY): Payer: Self-pay | Admitting: Obstetrics and Gynecology

## 2015-11-11 DIAGNOSIS — F410 Panic disorder [episodic paroxysmal anxiety] without agoraphobia: Secondary | ICD-10-CM | POA: Insufficient documentation

## 2015-11-11 DIAGNOSIS — F419 Anxiety disorder, unspecified: Secondary | ICD-10-CM | POA: Insufficient documentation

## 2015-12-04 DIAGNOSIS — N926 Irregular menstruation, unspecified: Secondary | ICD-10-CM | POA: Insufficient documentation

## 2016-06-16 DIAGNOSIS — G40309 Generalized idiopathic epilepsy and epileptic syndromes, not intractable, without status epilepticus: Secondary | ICD-10-CM | POA: Insufficient documentation

## 2018-03-15 ENCOUNTER — Encounter: Payer: Self-pay | Admitting: Neurology

## 2018-04-06 DIAGNOSIS — N840 Polyp of corpus uteri: Secondary | ICD-10-CM | POA: Insufficient documentation

## 2018-06-01 ENCOUNTER — Ambulatory Visit (INDEPENDENT_AMBULATORY_CARE_PROVIDER_SITE_OTHER): Payer: Managed Care, Other (non HMO) | Admitting: Neurology

## 2018-06-01 ENCOUNTER — Encounter: Payer: Self-pay | Admitting: Neurology

## 2018-06-01 ENCOUNTER — Other Ambulatory Visit: Payer: Self-pay

## 2018-06-01 VITALS — BP 128/78 | HR 85 | Ht 61.0 in | Wt 140.0 lb

## 2018-06-01 DIAGNOSIS — G40019 Localization-related (focal) (partial) idiopathic epilepsy and epileptic syndromes with seizures of localized onset, intractable, without status epilepticus: Secondary | ICD-10-CM | POA: Diagnosis not present

## 2018-06-01 DIAGNOSIS — N930 Postcoital and contact bleeding: Secondary | ICD-10-CM | POA: Insufficient documentation

## 2018-06-01 MED ORDER — LEVETIRACETAM 500 MG PO TABS: ORAL_TABLET | ORAL | 11 refills | Status: DC

## 2018-06-01 MED ORDER — CARBAMAZEPINE 200 MG PO TABS: ORAL_TABLET | ORAL | 1 refills | Status: DC

## 2018-06-01 MED ORDER — ZONISAMIDE 100 MG PO CAPS: ORAL_CAPSULE | ORAL | 11 refills | Status: DC
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
## 2018-06-01 NOTE — Patient Instructions (Addendum)
1. Start Zonisamide 100mg : Take 1 capsule every night for 2 weeks, then increase to 2 capsules every night for 2 weeks, then increase to 3 capsules every night and continue  2. When you increase the new medication (Zonisamide) to 2 capsules, you can start reducing the carbamazepine to 1 tablet daily. Then after 2 weeks, you can stop the carbamazepine  3. Continue Keppra XR 1500mg  daily  4. Follow-up in 3-4 months, call for any changes  Seizure Precautions: 1. If medication has been prescribed for you to prevent seizures, take it exactly as directed.  Do not stop taking the medicine without talking to your doctor first, even if you have not had a seizure in a long time.   2. Avoid activities in which a seizure would cause danger to yourself or to others.  Don't operate dangerous machinery, swim alone, or climb in high or dangerous places, such as on ladders, roofs, or girders.  Do not drive unless your doctor says you may.  3. If you have any warning that you may have a seizure, lay down in a safe place where you can't hurt yourself.    4.  No driving for 6 months from last seizure, as per [facility].   Please refer to the following link on the [place] website for more information: http://[email or web address]/[email or web address]/drivingu.cfm   5.  Maintain good sleep hygiene. Avoid alcohol.  6.  Contact your doctor if you have any problems that may be related to the medicine you are taking.  7.  Call 911 and bring the patient back to the ED if:        A.  The seizure lasts longer than 5 minutes.       B.  The patient doesn't awaken shortly after the seizure  C.  The patient has new problems such as difficulty seeing, speaking or moving  D.  The patient was injured during the seizure  E.  The patient has a temperature over 102 F (39C)  F.  The patient vomited and now is having trouble breathing

## 2018-06-13 ENCOUNTER — Encounter: Payer: Self-pay | Admitting: Neurology

## 2018-06-13 DIAGNOSIS — G40019 Localization-related (focal) (partial) idiopathic epilepsy and epileptic syndromes with seizures of localized onset, intractable, without status epilepticus: Secondary | ICD-10-CM | POA: Insufficient documentation

## 2018-07-12 ENCOUNTER — Other Ambulatory Visit: Payer: Self-pay

## 2018-07-12 DIAGNOSIS — F32A Depression, unspecified: Secondary | ICD-10-CM

## 2018-07-12 DIAGNOSIS — F329 Major depressive disorder, single episode, unspecified: Secondary | ICD-10-CM

## 2018-08-15 ENCOUNTER — Ambulatory Visit: Payer: Managed Care, Other (non HMO) | Admitting: Neurology

## 2018-08-22 ENCOUNTER — Encounter: Payer: Self-pay | Admitting: Neurology

## 2018-08-22 ENCOUNTER — Ambulatory Visit: Payer: Managed Care, Other (non HMO) | Admitting: Neurology

## 2018-08-22 ENCOUNTER — Ambulatory Visit (INDEPENDENT_AMBULATORY_CARE_PROVIDER_SITE_OTHER): Payer: Managed Care, Other (non HMO) | Admitting: Neurology

## 2018-08-22 ENCOUNTER — Other Ambulatory Visit: Payer: Self-pay

## 2018-08-22 VITALS — BP 130/88 | HR 98 | Ht 61.0 in | Wt 146.0 lb

## 2018-08-22 DIAGNOSIS — G40019 Localization-related (focal) (partial) idiopathic epilepsy and epileptic syndromes with seizures of localized onset, intractable, without status epilepticus: Secondary | ICD-10-CM | POA: Diagnosis not present

## 2018-08-22 MED ORDER — LEVETIRACETAM ER 500 MG PO TB24: ORAL_TABLET | ORAL | 11 refills | Status: DC

## 2018-08-22 MED ORDER — ZONISAMIDE 100 MG PO CAPS: ORAL_CAPSULE | ORAL | 11 refills | Status: DC

## 2018-08-22 NOTE — Patient Instructions (Addendum)
1. Schedule MRI brain with and without contrast  We have sent a referral to [facility] for your MRI and they will call you directly to schedule your appt. They are located at [address] [place]. If you need to contact them directly please call [phone].  2. Schedule 72-hour EEG 3. Continue Levetiracetam ER 500mg : Take 3 tablets every morning 4. Continue Zonisamide 300mg : take 3 tablets every night 5. Follow-up after tests, call for any changes  Seizure Precautions: 1. If medication has been prescribed for you to prevent seizures, take it exactly as directed.  Do not stop taking the medicine without talking to your doctor first, even if you have not had a seizure in a long time.   2. Avoid activities in which a seizure would cause danger to yourself or to others.  Don't operate dangerous machinery, swim alone, or climb in high or dangerous places, such as on ladders, roofs, or girders.  Do not drive unless your doctor says you may.  3. If you have any warning that you may have a seizure, lay down in a safe place where you can't hurt yourself.    4.  No driving for 6 months from last seizure, as per [facility].   Please refer to the following link on the [place] website for more information: http://[email or web address]/[email or web address]/driving/drivingu.cfm   5.  Maintain good sleep hygiene. Avoid alcohol.  6.  Contact your doctor if you have any problems that may be related to the medicine you are taking.  7.  Call 911 and bring the patient back to the ED if:        A.  The seizure lasts longer than 5 minutes.       B.  The patient doesn't awaken shortly after the seizure  C.  The patient has new problems such as difficulty seeing, speaking or moving  D.  The patient was injured during the seizure  E.  The patient has a temperature over 102 F (39C)  F.  The patient vomited and now is having trouble breathing
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
## 2018-09-09 ENCOUNTER — Other Ambulatory Visit: Payer: Managed Care, Other (non HMO)

## 2018-09-12 ENCOUNTER — Other Ambulatory Visit: Payer: Self-pay

## 2018-09-12 DIAGNOSIS — G40309 Generalized idiopathic epilepsy and epileptic syndromes, not intractable, without status epilepticus: Secondary | ICD-10-CM

## 2018-09-12 DIAGNOSIS — G40019 Localization-related (focal) (partial) idiopathic epilepsy and epileptic syndromes with seizures of localized onset, intractable, without status epilepticus: Secondary | ICD-10-CM

## 2018-09-19 ENCOUNTER — Ambulatory Visit (INDEPENDENT_AMBULATORY_CARE_PROVIDER_SITE_OTHER): Payer: Managed Care, Other (non HMO) | Admitting: Neurology

## 2018-09-19 DIAGNOSIS — G40019 Localization-related (focal) (partial) idiopathic epilepsy and epileptic syndromes with seizures of localized onset, intractable, without status epilepticus: Secondary | ICD-10-CM | POA: Diagnosis not present

## 2018-09-29 ENCOUNTER — Telehealth: Payer: Self-pay | Admitting: Neurology

## 2018-09-29 NOTE — Procedures (Addendum)
ELECTROENCEPHALOGRAM REPORT  Dates of Recording: 09/19/2018 8:14AM to 09/22/2018 8:27AM  Patient's Name: Karla Bell MRN: [id number] Date of Birth: [birth year]  Referring Provider: Dr. Ellouise Newer  Procedure: 72-hour ambulatory EEG  History: This is a 52 year old woman with recurrent seizures, increasing dizziness, and concern for nocturnal seizures. EEG for classification and assess for nocturnal seizures.  Medications:  KEPPRA 500 MG tablet  ZONEGRAN 100 MG capsule  ADVIL,MOTRIN 600 MG tablet   Technical Summary: This is a 72-hour multichannel digital EEG recording measured by the international 10-20 system with electrodes applied with paste and impedances below 5000 ohms performed as portable with EKG monitoring.  The digital EEG was referentially recorded, reformatted, and digitally filtered in a variety of bipolar and referential montages for optimal display.    DESCRIPTION OF RECORDING: During maximal wakefulness, the background activity consisted of a symmetric 8.5-9 Hz posterior dominant rhythm which was reactive to eye opening.  There were no epileptiform discharges or focal slowing seen in wakefulness.  During the recording, the patient progresses through wakefulness, drowsiness, and Stage 2 sleep.  Again, there were no epileptiform discharges seen.  Events: There were no push button events.   There were no electrographic seizures seen.  EKG lead was unremarkable.  IMPRESSION: This 72-hour ambulatory EEG study is normal.    CLINICAL CORRELATION: A normal EEG does not exclude a clinical diagnosis of epilepsy. Typical events were not captured. There were no nocturnal seizures seen. If further clinical questions remain, inpatient video EEG monitoring may be helpful.   Ellouise Newer, M.D.

## 2018-09-29 NOTE — Telephone Encounter (Signed)
MRI brain with and without contrast done 09/25/18: Chronic volume loss in the left mid frontal lobe extending to the convexity down to the corpus callosum. There is volume loss and cortical loss in this area with mild surrounding hyperintensity. No abnormal enhancement seen. There is compensatory enlargement of the left lateral ventricle due to volume loss. This is consistent with chronic ischemia.

## 2018-10-13 ENCOUNTER — Other Ambulatory Visit: Payer: Self-pay

## 2018-10-13 ENCOUNTER — Telehealth: Payer: Self-pay | Admitting: Neurology

## 2018-10-13 MED ORDER — LEVETIRACETAM ER 750 MG PO TB24: ORAL_TABLET | ORAL | 3 refills | Status: DC

## 2018-10-13 NOTE — Telephone Encounter (Signed)
Patient is calling stating that [facility] is needing a fax/phone call about a refill for the levetriacetam medication. They dont have this medication anymore and are needing a replacement drug. Please send.

## 2018-10-13 NOTE — Telephone Encounter (Signed)
Her MyChart message in regards to this has been forwarded to the providers.  I am awaiting response.

## 2018-11-17 ENCOUNTER — Other Ambulatory Visit: Payer: Self-pay

## 2018-11-17 MED ORDER — BRIVARACETAM 50 MG PO TABS: 50.0000 mg | ORAL_TABLET | Freq: Two times a day (BID) | ORAL | 0 refills | Status: DC

## 2018-11-17 MED ORDER — BRIVARACETAM 50 MG PO TABS: 50.0000 mg | ORAL_TABLET | Freq: Two times a day (BID) | ORAL | 5 refills | Status: DC

## 2018-11-17 MED ORDER — BRIVARACETAM 25 MG PO TABS: 25.0000 mg | ORAL_TABLET | Freq: Two times a day (BID) | ORAL | 0 refills | Status: DC

## 2018-12-26 ENCOUNTER — Other Ambulatory Visit: Payer: Self-pay | Admitting: Neurology

## 2018-12-26 MED ORDER — BRIVARACETAM 100 MG PO TABS: 100.0000 mg | ORAL_TABLET | Freq: Two times a day (BID) | ORAL | 5 refills | Status: DC

## 2019-01-05 DIAGNOSIS — L821 Other seborrheic keratosis: Secondary | ICD-10-CM | POA: Insufficient documentation

## 2019-01-05 DIAGNOSIS — H02833 Dermatochalasis of right eye, unspecified eyelid: Secondary | ICD-10-CM | POA: Insufficient documentation

## 2019-01-05 DIAGNOSIS — H02836 Dermatochalasis of left eye, unspecified eyelid: Secondary | ICD-10-CM

## 2019-03-22 ENCOUNTER — Telehealth (INDEPENDENT_AMBULATORY_CARE_PROVIDER_SITE_OTHER): Payer: Managed Care, Other (non HMO) | Admitting: Neurology

## 2019-03-22 ENCOUNTER — Ambulatory Visit: Payer: Managed Care, Other (non HMO) | Admitting: Neurology

## 2019-03-22 ENCOUNTER — Other Ambulatory Visit: Payer: Self-pay

## 2019-03-22 DIAGNOSIS — G40019 Localization-related (focal) (partial) idiopathic epilepsy and epileptic syndromes with seizures of localized onset, intractable, without status epilepticus: Secondary | ICD-10-CM

## 2019-03-22 MED ORDER — BRIVARACETAM 100 MG PO TABS: 100.0000 mg | ORAL_TABLET | Freq: Two times a day (BID) | ORAL | 3 refills | Status: DC

## 2019-03-22 MED ORDER — ZONISAMIDE 100 MG PO CAPS: ORAL_CAPSULE | ORAL | 3 refills | Status: DC
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
## 2019-04-19 ENCOUNTER — Other Ambulatory Visit: Payer: Self-pay

## 2019-04-19 ENCOUNTER — Telehealth: Payer: Self-pay | Admitting: Neurology

## 2019-04-19 MED ORDER — BRIVIACT 100 MG PO TABS: 100.0000 mg | ORAL_TABLET | Freq: Two times a day (BID) | ORAL | 3 refills | Status: DC

## 2019-04-19 MED ORDER — ZONISAMIDE 100 MG PO CAPS: ORAL_CAPSULE | ORAL | 3 refills | Status: DC

## 2019-04-19 NOTE — Addendum Note (Signed)
Addended by: Cameron Sprang on: 04/19/2019 11:33 AM   Modules accepted: Orders

## 2019-04-19 NOTE — Telephone Encounter (Signed)
Pt is requesting refill for briviant and zonisamide for 90-day supply sent to [facility] on [address].

## 2019-04-20 NOTE — Progress Notes (Signed)
Briviact prior authorization submitted via covermymeds.  Waiting response.

## 2019-04-24 NOTE — Progress Notes (Signed)
Per covermymeds prior auth not required.

## 2019-05-14 ENCOUNTER — Telehealth: Payer: Self-pay | Admitting: Neurology

## 2019-05-14 NOTE — Telephone Encounter (Signed)
Thursday felt lack of air , weak, no strength in arms.  States not seizures, thinks panic attacks instead.  Denies chest pain, sweating, loosing consciousness, eating anything she might have been allergic too.  Does not take anything for anxiety and does not really want to. She is taking seizure meds as prescribed  Pt was on vacation last week when episodes happened.

## 2019-05-14 NOTE — Telephone Encounter (Signed)
Patient left vm about having a panic attack and that she was trying to celebrate her birthday and had a couple glasses of wine. But then she said just to have you call her back. Thanks!

## 2019-05-14 NOTE — Telephone Encounter (Signed)
Left message to return call 

## 2019-05-14 NOTE — Telephone Encounter (Signed)
Can you pls ask her if there were any potential triggers for the episodes:  Missed medications?   Sleep deprived?   Alcohol intake?     For now let's keep an eye on it and see how she does back at regular routine.

## 2019-05-14 NOTE — Telephone Encounter (Signed)
Patient left msg with after hours about having 3 seizures on Sunday. Thanks!

## 2019-05-14 NOTE — Telephone Encounter (Signed)
Left message for pt.  Informed pt to call if symptoms occurred again. That Dr. Delice Lesch would like to get pt back to her normal routine after vacation.  Pt already confirmed that she was taking medications as prescribed and since being on vacation she out of her normal routine.  Pt instructed to call with concerns

## 2019-05-15 NOTE — Telephone Encounter (Signed)
Left message again for pt. Instructed her to call back if she continued to have episodes of panic attacks.  Informed that Dr. Delice Lesch felt like pt needed to get back to her normal routine after vacation.  If she continues to have episodes pt advised to call the office.

## 2019-09-05 ENCOUNTER — Telehealth: Payer: Self-pay | Admitting: Neurology

## 2019-09-05 NOTE — Telephone Encounter (Signed)
Dr. Delice Lesch will you be able to write her a note? I can call and get more info if needed. I have documented that her last seizure was in July this year.

## 2019-09-05 NOTE — Telephone Encounter (Signed)
Patient called and requested a letter for work. She said she normally works from home but is being asked to come into the office for the next 3 weeks. Patient is concerned about driving and said she is afraid to drive right now because she may have a seizure. She said the stress is too much for her at this time.

## 2019-09-05 NOTE — Telephone Encounter (Signed)
Pls get more info, thanks!

## 2019-09-06 NOTE — Telephone Encounter (Signed)
Left message for pt to return call.

## 2019-09-10 NOTE — Telephone Encounter (Signed)
Called patient to request additional information no answer left message to call office back with more information for letter.

## 2019-10-26 ENCOUNTER — Ambulatory Visit: Payer: Managed Care, Other (non HMO) | Admitting: Neurology

## 2019-12-20 ENCOUNTER — Encounter: Payer: Self-pay | Admitting: Neurology

## 2019-12-21 ENCOUNTER — Telehealth (INDEPENDENT_AMBULATORY_CARE_PROVIDER_SITE_OTHER): Payer: Managed Care, Other (non HMO) | Admitting: Neurology

## 2019-12-21 ENCOUNTER — Other Ambulatory Visit: Payer: Self-pay

## 2019-12-21 DIAGNOSIS — Z5329 Procedure and treatment not carried out because of patient's decision for other reasons: Secondary | ICD-10-CM

## 2019-12-27 ENCOUNTER — Other Ambulatory Visit: Payer: Self-pay

## 2019-12-27 ENCOUNTER — Telehealth (INDEPENDENT_AMBULATORY_CARE_PROVIDER_SITE_OTHER): Payer: Managed Care, Other (non HMO) | Admitting: Neurology

## 2019-12-27 DIAGNOSIS — G40019 Localization-related (focal) (partial) idiopathic epilepsy and epileptic syndromes with seizures of localized onset, intractable, without status epilepticus: Secondary | ICD-10-CM

## 2019-12-27 DIAGNOSIS — F410 Panic disorder [episodic paroxysmal anxiety] without agoraphobia: Secondary | ICD-10-CM

## 2019-12-27 DIAGNOSIS — F419 Anxiety disorder, unspecified: Secondary | ICD-10-CM

## 2019-12-27 MED ORDER — BRIVIACT 100 MG PO TABS: 100.0000 mg | ORAL_TABLET | Freq: Two times a day (BID) | ORAL | 3 refills | Status: DC

## 2019-12-27 MED ORDER — SERTRALINE HCL 25 MG PO TABS: 25.0000 mg | ORAL_TABLET | Freq: Every day | ORAL | 6 refills | Status: DC

## 2019-12-27 MED ORDER — ZONISAMIDE 100 MG PO CAPS: ORAL_CAPSULE | ORAL | 3 refills | Status: DC
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
## 2019-12-28 ENCOUNTER — Telehealth: Payer: Self-pay | Admitting: Neurology

## 2019-12-28 NOTE — Telephone Encounter (Signed)
Karla Bell ([id number]) Rx #[id number] Briviact 100MG  tablets   Form [employer] Electronic PA Form (NCPDP) Created 20 hours ago Sent to Plan 1 minute ago Plan Response 1 minute ago Submit Clinical Questions Determination N/A Message from Plan Please advise the dispensing pharmacy to contact the [facility] at [phone] for assistance.

## 2020-01-08 NOTE — Progress Notes (Signed)
Patient r/s to 12/27/2019

## 2020-05-03 ENCOUNTER — Other Ambulatory Visit: Payer: Self-pay | Admitting: Neurology

## 2020-05-04 MED ORDER — BRIVIACT 100 MG PO TABS: 100.0000 mg | ORAL_TABLET | Freq: Two times a day (BID) | ORAL | 3 refills | Status: DC

## 2020-05-04 MED ORDER — ZONISAMIDE 100 MG PO CAPS: ORAL_CAPSULE | ORAL | 3 refills | Status: DC

## 2020-07-16 ENCOUNTER — Ambulatory Visit: Payer: Managed Care, Other (non HMO) | Admitting: Neurology

## 2020-07-24 ENCOUNTER — Ambulatory Visit (INDEPENDENT_AMBULATORY_CARE_PROVIDER_SITE_OTHER): Payer: Managed Care, Other (non HMO) | Admitting: Neurology

## 2020-07-24 ENCOUNTER — Encounter: Payer: Self-pay | Admitting: Neurology

## 2020-07-24 ENCOUNTER — Other Ambulatory Visit: Payer: Self-pay

## 2020-07-24 VITALS — BP 127/79 | HR 79 | Ht 61.0 in | Wt 129.2 lb

## 2020-07-24 DIAGNOSIS — F321 Major depressive disorder, single episode, moderate: Secondary | ICD-10-CM | POA: Diagnosis not present

## 2020-07-24 DIAGNOSIS — G40019 Localization-related (focal) (partial) idiopathic epilepsy and epileptic syndromes with seizures of localized onset, intractable, without status epilepticus: Secondary | ICD-10-CM

## 2020-07-24 MED ORDER — BRIVIACT 100 MG PO TABS: 100.0000 mg | ORAL_TABLET | Freq: Two times a day (BID) | ORAL | 3 refills | Status: DC

## 2020-07-24 MED ORDER — ZONISAMIDE 100 MG PO CAPS: ORAL_CAPSULE | ORAL | 3 refills | Status: DC
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
## 2020-07-24 NOTE — Progress Notes (Signed)
Thinks she may have had a seizure 2 months ago when she was in the bed. After the episodes she was SOB and not clear mind for most of the day. Pt stated she has depression would like to talk to some one

## 2020-07-24 NOTE — Patient Instructions (Signed)
1. Continue Briviact 100mg  twice a day and Zonisamide 300mg  every night  2. Referral will be sent for counseling. If sleep does not improve with counseling, call our office and we will plan to increase Zonisamide to provide more cushion for seizures since you are not sleeping well  3. Follow-up in 6 months, call for any changes   Seizure Precautions: 1. If medication has been prescribed for you to prevent seizures, take it exactly as directed.  Do not stop taking the medicine without talking to your doctor first, even if you have not had a seizure in a long time.   2. Avoid activities in which a seizure would cause danger to yourself or to others.  Don't operate dangerous machinery, swim alone, or climb in high or dangerous places, such as on ladders, roofs, or girders.  Do not drive unless your doctor says you may.  3. If you have any warning that you may have a seizure, lay down in a safe place where you can't hurt yourself.    4.  No driving for 6 months from last seizure, as per [facility].   Please refer to the following link on the [place] website for more information: http://www.epilepsyfoundation.org/answerplace/Social/driving/drivingu.cfm   5.  Maintain good sleep hygiene. Avoid alcohol.  6.  Contact your doctor if you have any problems that may be related to the medicine you are taking.  7.  Call 911 and bring the patient back to the ED if:        A.  The seizure lasts longer than 5 minutes.       B.  The patient doesn't awaken shortly after the seizure  C.  The patient has new problems such as difficulty seeing, speaking or moving  D.  The patient was injured during the seizure  E.  The patient has a temperature over 102 F (39C)  F.  The patient vomited and now is having trouble breathing

## 2020-07-30 ENCOUNTER — Telehealth: Payer: Self-pay | Admitting: Neurology

## 2020-07-30 ENCOUNTER — Encounter: Payer: Self-pay | Admitting: Neurology

## 2020-07-30 NOTE — Telephone Encounter (Signed)
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 She said, "It needs to state that I recently had a seizure and I cannot drive for at least six months." I can come and pick it up.

## 2020-07-30 NOTE — Telephone Encounter (Signed)
Pls let her know letter is ready, we can mail it to her, unless she has a driver to pick it up. Thanks

## 2020-07-31 NOTE — Telephone Encounter (Signed)
Pt called was on another call when she answered waited a few mins she never talked will try to call back later,

## 2020-07-31 NOTE — Telephone Encounter (Signed)
Spoke to pt letter placed in the mail,

## 2020-08-18 ENCOUNTER — Telehealth: Payer: Self-pay | Admitting: Neurology

## 2020-08-18 MED ORDER — ZONISAMIDE 100 MG PO CAPS: ORAL_CAPSULE | ORAL | 3 refills | Status: DC

## 2020-08-18 NOTE — Telephone Encounter (Signed)
Pls let her know I have sent in a prescription for increased dose of Zonisamide 400mg  qhs as we discussed. Thanks

## 2020-08-18 NOTE — Telephone Encounter (Signed)
Pt called no answer voice mail left for pt to call the office back  

## 2020-08-18 NOTE — Telephone Encounter (Signed)
Patient left a VM and states that she is having problems sleep. She is not staying asleep and cant go back to sleep when she wakes up. She would like to talk about increasing the night medication

## 2020-08-19 NOTE — Telephone Encounter (Signed)
Pt called no answer voice mail left for pt to call the office back  

## 2020-08-20 ENCOUNTER — Telehealth: Payer: Self-pay

## 2020-08-20 ENCOUNTER — Telehealth: Payer: Self-pay | Admitting: Neurology

## 2020-08-20 NOTE — Telephone Encounter (Signed)
Pt called the office and was informed that her Dr Delice Lesch  increased dose of Zonisamide to 400mg  qhs as  Discussed. Pt verbalized understanding

## 2020-09-02 ENCOUNTER — Ambulatory Visit: Payer: 59 | Admitting: Psychology

## 2020-09-30 ENCOUNTER — Telehealth: Payer: Self-pay | Admitting: Neurology

## 2020-09-30 MED ORDER — ZONISAMIDE 100 MG PO CAPS: ORAL_CAPSULE | ORAL | 3 refills | Status: DC

## 2020-09-30 NOTE — Telephone Encounter (Signed)
Rx sent, thanks 

## 2020-09-30 NOTE — Telephone Encounter (Signed)
Pt states that she is taking 4 tablets a day of the Zonisamide and she wants to go back to 3 tablets a day. She needs a RX sent to [facility] today  Please

## 2021-01-21 ENCOUNTER — Other Ambulatory Visit: Payer: Self-pay

## 2021-01-21 ENCOUNTER — Ambulatory Visit (INDEPENDENT_AMBULATORY_CARE_PROVIDER_SITE_OTHER): Payer: Managed Care, Other (non HMO) | Admitting: Neurology

## 2021-01-21 ENCOUNTER — Encounter: Payer: Self-pay | Admitting: Neurology

## 2021-01-21 VITALS — BP 116/75 | HR 72 | Ht 60.0 in | Wt 128.2 lb

## 2021-01-21 DIAGNOSIS — G40019 Localization-related (focal) (partial) idiopathic epilepsy and epileptic syndromes with seizures of localized onset, intractable, without status epilepticus: Secondary | ICD-10-CM

## 2021-01-21 MED ORDER — ZONISAMIDE 100 MG PO CAPS: ORAL_CAPSULE | ORAL | 3 refills | Status: DC

## 2021-01-21 MED ORDER — BRIVIACT 100 MG PO TABS: 100.0000 mg | ORAL_TABLET | Freq: Two times a day (BID) | ORAL | 3 refills | Status: DC

## 2021-01-21 NOTE — Patient Instructions (Signed)
Always good to see you! Continue all your medications. Follow-up in 6-7 months, call for any changes.   Seizure Precautions: 1. If medication has been prescribed for you to prevent seizures, take it exactly as directed.  Do not stop taking the medicine without talking to your doctor first, even if you have not had a seizure in a long time.   2. Avoid activities in which a seizure would cause danger to yourself or to others.  Don't operate dangerous machinery, swim alone, or climb in high or dangerous places, such as on ladders, roofs, or girders.  Do not drive unless your doctor says you may.  3. If you have any warning that you may have a seizure, lay down in a safe place where you can't hurt yourself.    4.  No driving for 6 months from last seizure, as per [facility].   Please refer to the following link on the [place] website for more information: http://www.epilepsyfoundation.org/answerplace/Social/driving/drivingu.cfm   5.  Maintain good sleep hygiene. Avoid alcohol.  6.  Contact your doctor if you have any problems that may be related to the medicine you are taking.  7.  Call 911 and bring the patient back to the ED if:        A.  The seizure lasts longer than 5 minutes.       B.  The patient doesn't awaken shortly after the seizure  C.  The patient has new problems such as difficulty seeing, speaking or moving  D.  The patient was injured during the seizure  E.  The patient has a temperature over 102 F (39C)  F.  The patient vomited and now is having trouble breathing
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
## 2021-07-25 ENCOUNTER — Other Ambulatory Visit: Payer: Self-pay | Admitting: Neurology

## 2021-08-05 ENCOUNTER — Other Ambulatory Visit: Payer: Self-pay

## 2021-08-05 ENCOUNTER — Ambulatory Visit (INDEPENDENT_AMBULATORY_CARE_PROVIDER_SITE_OTHER): Payer: Managed Care, Other (non HMO) | Admitting: Neurology

## 2021-08-05 ENCOUNTER — Encounter: Payer: Self-pay | Admitting: Neurology

## 2021-08-05 VITALS — BP 133/77 | HR 90 | Resp 18 | Ht 61.0 in | Wt 130.0 lb

## 2021-08-05 DIAGNOSIS — G40019 Localization-related (focal) (partial) idiopathic epilepsy and epileptic syndromes with seizures of localized onset, intractable, without status epilepticus: Secondary | ICD-10-CM | POA: Diagnosis not present

## 2021-08-05 DIAGNOSIS — F419 Anxiety disorder, unspecified: Secondary | ICD-10-CM | POA: Diagnosis not present

## 2021-08-05 DIAGNOSIS — F321 Major depressive disorder, single episode, moderate: Secondary | ICD-10-CM

## 2021-08-05 DIAGNOSIS — R634 Abnormal weight loss: Secondary | ICD-10-CM

## 2021-08-05 MED ORDER — ZONISAMIDE 100 MG PO CAPS: ORAL_CAPSULE | ORAL | 3 refills | Status: AC

## 2021-08-05 MED ORDER — BRIVARACETAM 100 MG PO TABS: 100.0000 mg | ORAL_TABLET | Freq: Two times a day (BID) | ORAL | 3 refills | Status: AC
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
## 2021-08-05 NOTE — Progress Notes (Signed)
Medication Samples have been provided to the patient.  Drug name: briviact       Strength: 100mg         Qty: 1 box  LOT: [id number]  Exp.Date: 5 /2025  Dosing instructions: take 1 tab BID  The patient has been instructed regarding the correct time, dose, and frequency of taking this medication, including desired effects and most common side effects.
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
## 2021-08-19 ENCOUNTER — Telehealth: Payer: Self-pay | Admitting: Neurology

## 2021-08-19 NOTE — Telephone Encounter (Signed)
Pt called in stating she would like her medical records sent to another office. I gave her the medical records department's phone number.

## 2021-09-18 ENCOUNTER — Encounter: Payer: Self-pay | Admitting: Neurology

## 2021-10-26 ENCOUNTER — Ambulatory Visit: Payer: Managed Care, Other (non HMO) | Admitting: Skilled Nursing Facility1

## 2022-02-01 ENCOUNTER — Ambulatory Visit: Payer: Managed Care, Other (non HMO) | Admitting: Neurology

## 2022-02-19 ENCOUNTER — Ambulatory Visit: Payer: Managed Care, Other (non HMO) | Admitting: Neurology

## 2023-01-24 ENCOUNTER — Institutional Professional Consult (permissible substitution): Payer: Managed Care, Other (non HMO) | Admitting: Plastic Surgery

## 2023-02-18 ENCOUNTER — Institutional Professional Consult (permissible substitution): Payer: Managed Care, Other (non HMO) | Admitting: Plastic Surgery

## 2023-03-03 ENCOUNTER — Ambulatory Visit (HOSPITAL_COMMUNITY): Payer: 59 | Admitting: Psychiatry
